# Patient Record
Sex: Female | Born: 1970 | Race: Black or African American | Hispanic: No | Marital: Single | Smoking: Never smoker
Health system: Southern US, Community
[De-identification: ages and names within clinical notes are randomized; demographics above are authoritative.]

---

## 2000-08-24 HISTORY — PX: BREAST BIOPSY: SHX20

## 2021-05-30 ENCOUNTER — Emergency Department (HOSPITAL_COMMUNITY): Payer: Medicaid - Out of State

## 2021-05-30 ENCOUNTER — Emergency Department (HOSPITAL_COMMUNITY)
Admission: EM | Admit: 2021-05-30 | Discharge: 2021-05-30 | Disposition: A | Discharge status: Home / Self Care | Payer: Medicaid - Out of State | Attending: Emergency Medicine | Admitting: Emergency Medicine

## 2021-05-30 ENCOUNTER — Other Ambulatory Visit: Payer: Self-pay

## 2021-05-30 ENCOUNTER — Encounter (HOSPITAL_COMMUNITY): Payer: Self-pay

## 2021-05-30 DIAGNOSIS — H53149 Visual discomfort, unspecified: Secondary | ICD-10-CM | POA: Diagnosis not present

## 2021-05-30 DIAGNOSIS — R112 Nausea with vomiting, unspecified: Secondary | ICD-10-CM | POA: Diagnosis not present

## 2021-05-30 DIAGNOSIS — R519 Headache, unspecified: Secondary | ICD-10-CM | POA: Diagnosis present

## 2021-05-30 DIAGNOSIS — H538 Other visual disturbances: Secondary | ICD-10-CM | POA: Diagnosis not present

## 2021-05-30 LAB — CBC WITH DIFFERENTIAL/PLATELET
Abs Immature Granulocytes: 0.02 10*3/uL (ref 0.00–0.07)
Basophils Absolute: 0.1 10*3/uL (ref 0.0–0.1)
Basophils Relative: 1 %
Eosinophils Absolute: 0.1 10*3/uL (ref 0.0–0.5)
Eosinophils Relative: 1 %
HCT: 37.3 % (ref 36.0–46.0)
Hemoglobin: 12.4 g/dL (ref 12.0–15.0)
Immature Granulocytes: 0 %
Lymphocytes Relative: 26 %
Lymphs Abs: 2.3 10*3/uL (ref 0.7–4.0)
MCH: 29.3 pg (ref 26.0–34.0)
MCHC: 33.2 g/dL (ref 30.0–36.0)
MCV: 88.2 fL (ref 80.0–100.0)
Monocytes Absolute: 0.5 10*3/uL (ref 0.1–1.0)
Monocytes Relative: 6 %
Neutro Abs: 5.9 10*3/uL (ref 1.7–7.7)
Neutrophils Relative %: 66 %
Platelets: 341 10*3/uL (ref 150–400)
RBC: 4.23 MIL/uL (ref 3.87–5.11)
RDW: 15.9 % — ABNORMAL HIGH (ref 11.5–15.5)
WBC: 8.7 10*3/uL (ref 4.0–10.5)
nRBC: 0 % (ref 0.0–0.2)

## 2021-05-30 LAB — I-STAT CHEM 8, ED
BUN: 11 mg/dL (ref 6–20)
Calcium, Ion: 1.1 mmol/L — ABNORMAL LOW (ref 1.15–1.40)
Chloride: 108 mmol/L (ref 98–111)
Creatinine, Ser: 0.6 mg/dL (ref 0.44–1.00)
Glucose, Bld: 110 mg/dL — ABNORMAL HIGH (ref 70–99)
HCT: 38 % (ref 36.0–46.0)
Hemoglobin: 12.9 g/dL (ref 12.0–15.0)
Potassium: 3.8 mmol/L (ref 3.5–5.1)
Sodium: 140 mmol/L (ref 135–145)
TCO2: 22 mmol/L (ref 22–32)

## 2021-05-30 LAB — BASIC METABOLIC PANEL
Anion gap: 7 (ref 5–15)
BUN: 11 mg/dL (ref 6–20)
CO2: 23 mmol/L (ref 22–32)
Calcium: 9.4 mg/dL (ref 8.9–10.3)
Chloride: 106 mmol/L (ref 98–111)
Creatinine, Ser: 0.72 mg/dL (ref 0.44–1.00)
GFR, Estimated: >60 mL/min (ref >60)
Glucose, Bld: 113 mg/dL — ABNORMAL HIGH (ref 70–99)
Potassium: 3.7 mmol/L (ref 3.5–5.1)
Sodium: 136 mmol/L (ref 135–145)

## 2021-05-30 IMAGING — CT CT ANGIO HEAD
3 of 14 series · 15 of 47 positions shown · IV contrast (omnipaque)
Comparison: No pertinent prior exams available for comparison.

CLINICAL DATA: There desiccation acute, stroke suspected; sudden
onset headache at 6 p.m. last night, now with blurry vision,
vomiting.

EXAM:
CT ANGIOGRAPHY HEAD
TECHNIQUE: Multidetector CT imaging of the head was performed using the
standard protocol during bolus administration of intravenous
contrast. Multiplanar CT image reconstructions and MIPs were
obtained to evaluate the vascular anatomy.
CONTRAST:  50mL OMNIPAQUE IOHEXOL 350 MG/ML SOLN

[Series 13: headangio 2.0 hr36 3 · axial · 0.39mm/px · z∈[-123,-81]mm · 2 of 84 slices shown]
[im 21/84  brain]
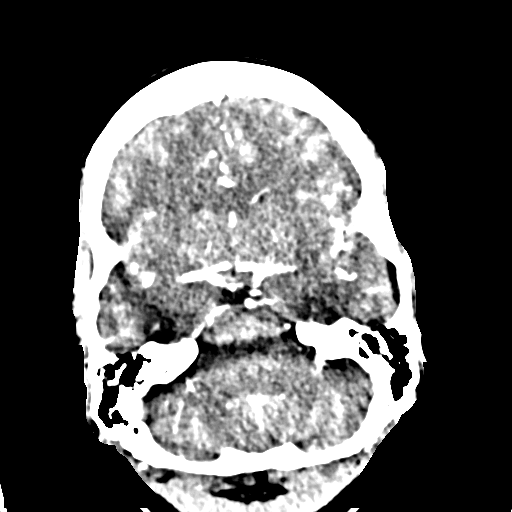
[im 42/84  brain]
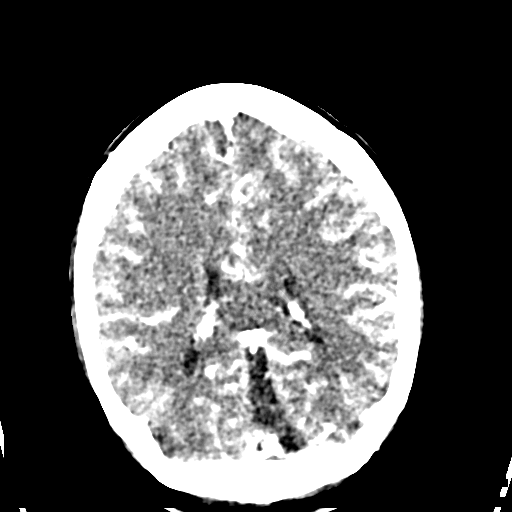

[Series 15: headangio 1.0 mpr ax · axial · 0.34mm/px · z∈[-193,-77]mm · 10 of 169 slices shown]
[im 16/169  brain]
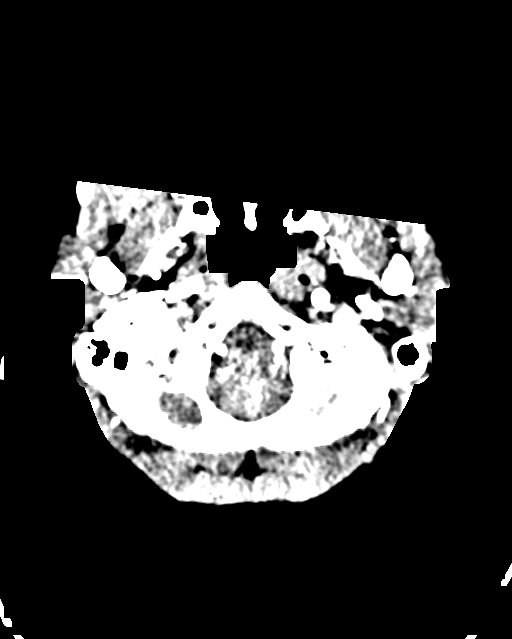
[im 31/169  bone]
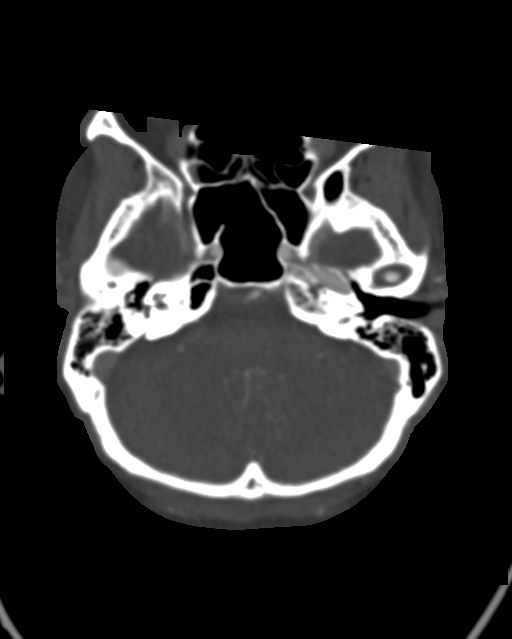
[im 46/169  brain]
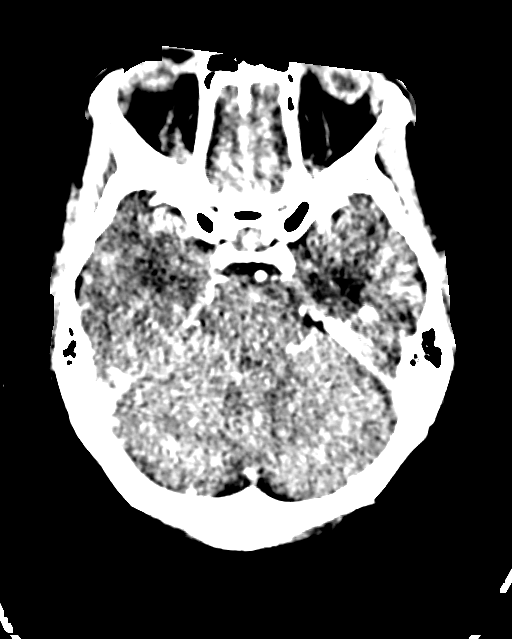
[im 62/169  bone]
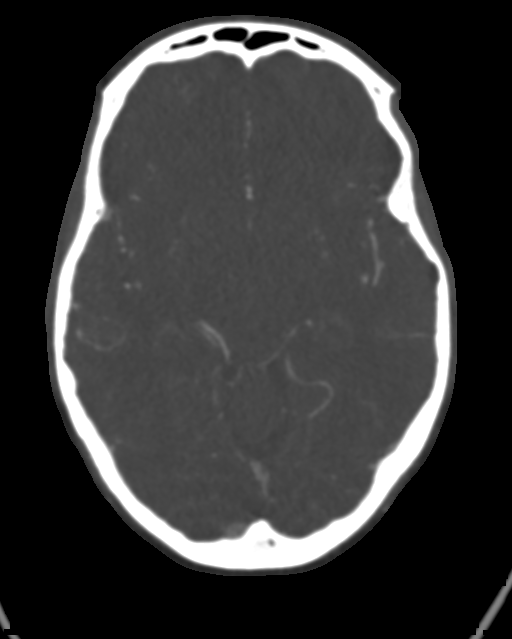
[im 77/169  brain]
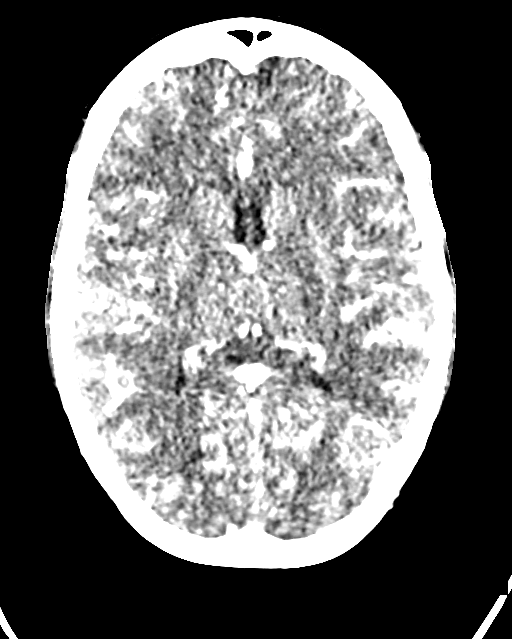
[im 92/169  bone]
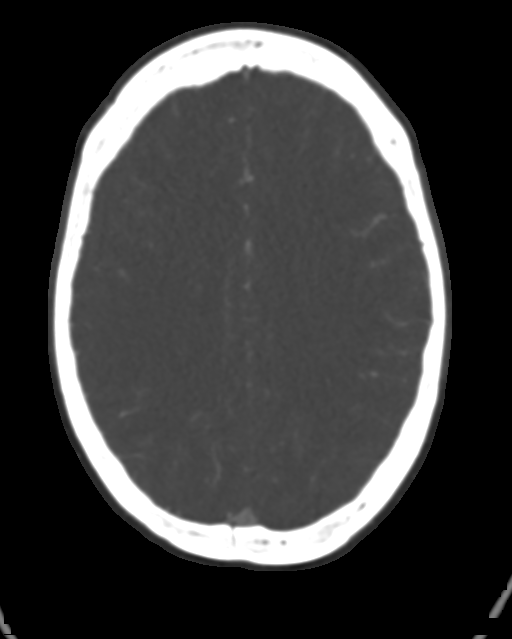
[im 107/169  brain]
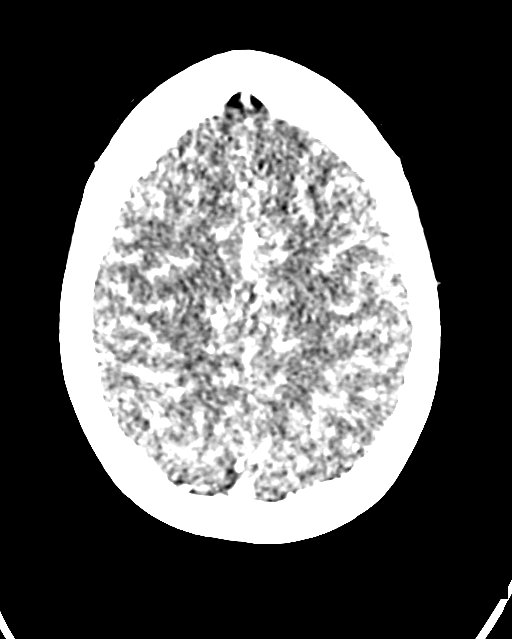
[im 123/169  bone]
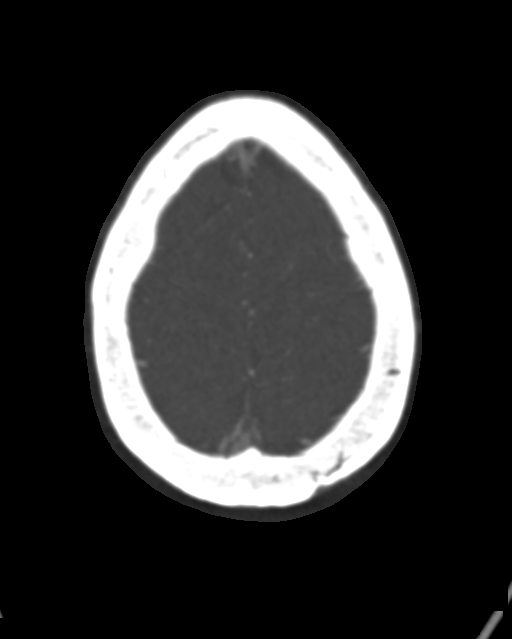
[im 138/169  brain]
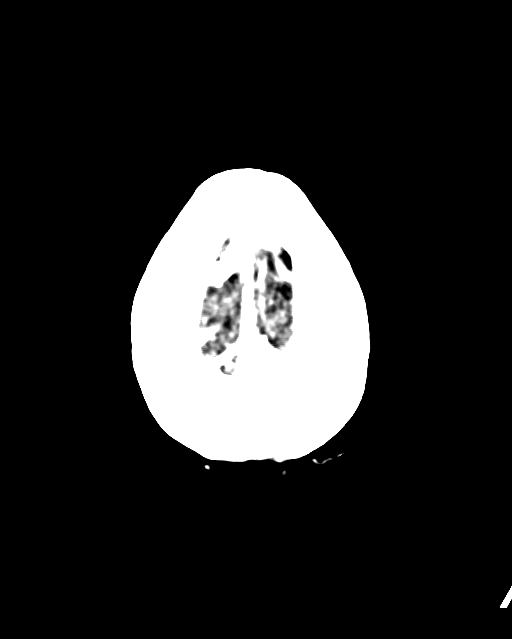
[im 153/169  bone]
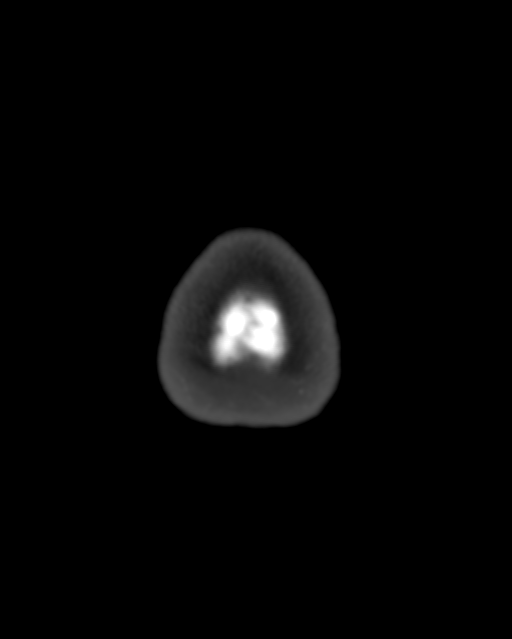

[Series 16: headangio 1.0 mpr cor · coronal · 0.33mm/px · 3 of 209 slices shown]
[im 73/209  brain]
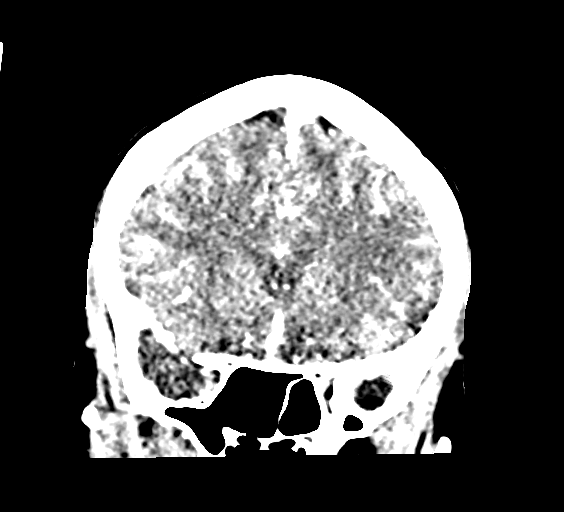
[im 94/209  brain]
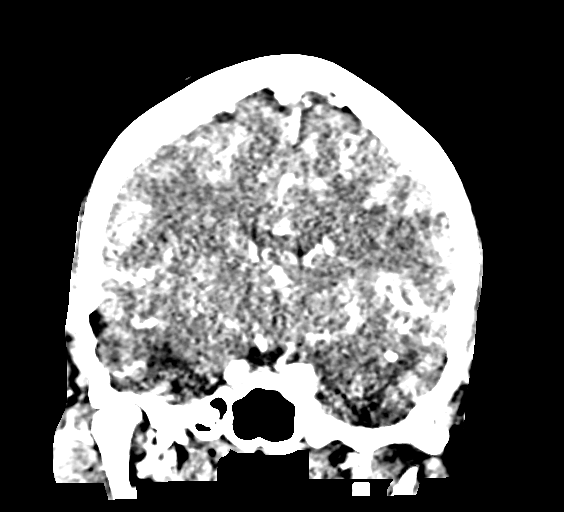
[im 115/209  brain]
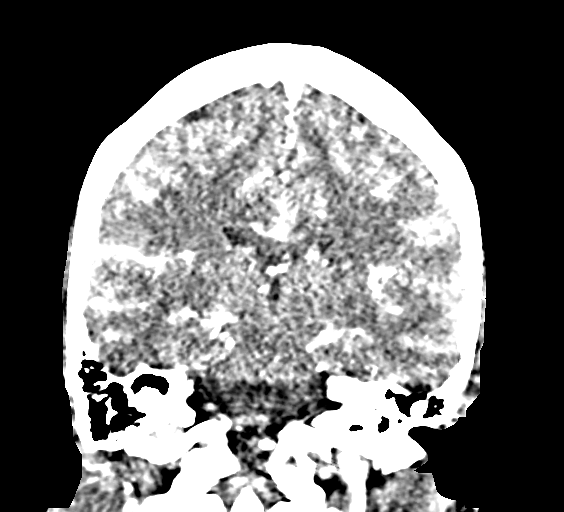

[15 of 47 positions shown; findings below may reference images not displayed]

FINDINGS: CT AYATI

AYATI

Cerebral volume is normal.

Cerebellar tonsillar ectopia (right greater than left) with the
right cerebellar tonsil extending up to 6 mm below the level of
foramen magnum.

There is no acute intracranial hemorrhage.

No demarcated cortical infarct.

No extra-axial fluid collection.

No evidence of an intracranial mass.

No midline shift.

Vascular: No hyperdense vessel.

Skull: Normal. Negative for fracture or focal lesion.

Sinuses: No significant paranasal sinus disease.

Orbits: No mass or acute finding.

CTA HEAD

Anterior circulation:

The intracranial internal carotid arteries are patent. The M1 middle
cerebral arteries are patent. No M2 proximal branch occlusion or
high-grade proximal stenosis is identified. The anterior cerebral
arteries are patent.

2 mm posteriorly projecting vascular protrusion arising from the
proximal cavernous right ICA, which may reflect an aneurysm or
infundibulum (series 15, image 130).

1-2 mm posteriorly projecting vascular protrusion arising from the
proximal cavernous left ICA, which may reflect an aneurysm or
infundibulum (series 15, image 130).

Posterior circulation:

The intracranial vertebral arteries are patent. The basilar artery
is patent. The posterior cerebral arteries are patent. A right
posterior communicating artery is present. Left posterior
communicating artery is diminutive or absent.

Venous sinuses: Within the limitations of contrast timing, no
convincing thrombus.

Anatomic variants: As described

Review of the MIP images confirms the above findings.
IMPRESSION: CT head:

1. No evidence of acute intracranial abnormality.
2. Cerebellar tonsillar ectopia (right greater than left) with the
right cerebellar tonsil extending up to 6 mm below the level of
foramen magnum. This meets measurement criteria for Chiari 1
malformation. However, the cerebellar tonsils maintain a normal
rounded morphology. Alternatively, cerebellar tonsillar ectopia may
be seen in setting of idiopathic intracranial hypertension
(pseudotumor cerebri) or intracranial hypotension. Consider a brain
MRI for further characterization.

CTA head:

1. No intracranial large vessel occlusion or proximal high-grade
arterial stenosis.
2. 2 mm posteriorly projecting vascular protrusion arising from the
proximal cavernous right ICA, which may reflect an aneurysm or
infundibulum.
3. 1-2 mm posteriorly projecting vascular protrusion arising from
the proximal cavernous left ICA, which may reflect an aneurysm or
infundibulum.

## 2021-05-30 IMAGING — MR MR HEAD WO/W CM
6 of 12 series · 26 of 48 positions shown · IV contrast (gadavist)
Comparison: Head CTA [DATE]

CLINICAL DATA: Acute headache, photophobia, blurry vision, nausea,
and vomiting.

EXAM:
MRI HEAD WITHOUT AND WITH CONTRAST
MR VENOGRAM HEAD WITHOUT AND WITH CONTRAST
TECHNIQUE: Multiplanar, multi-echo pulse sequences of the brain and surrounding
structures were acquired without and with intravenous contrast.
Angiographic images of the intracranial venous structures were
acquired using MRV technique without and with intravenous contrast.
CONTRAST:  6.5mL GADAVIST GADOBUTROL 1 MMOL/ML IV SOLN

[Series 2: DWI · axial · 3.0mm · 0.94mm/px · z∈[-95,+45]mm · 8 of 100 slices shown (1 of 2)]
[im 1/100]
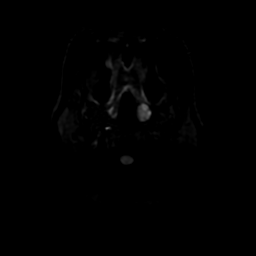
[im 15/100]
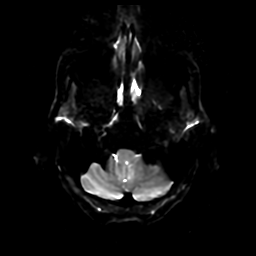
[im 29/100]
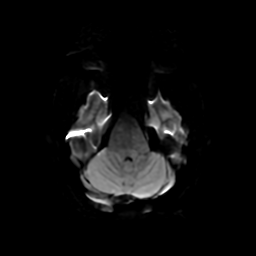
[im 43/100]
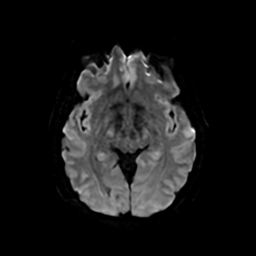
[im 57/100]
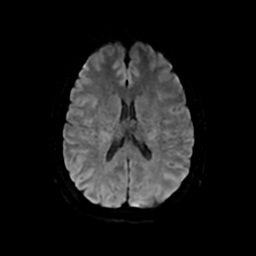
[im 71/100]
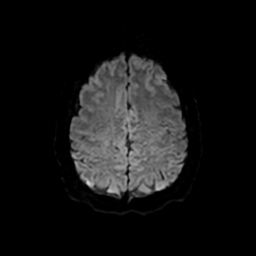
[im 85/100]
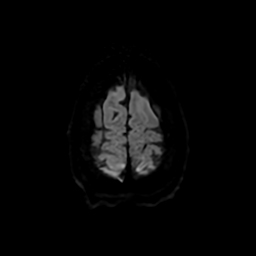
[im 100/100]
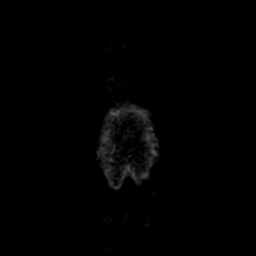

[Series 3: DWI · coronal · 4.0mm · 0.94mm/px · 6 of 76 slices shown (2 of 2)]
[im 1/76]
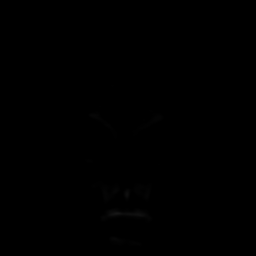
[im 16/76]
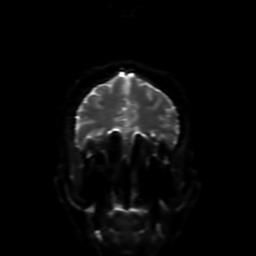
[im 31/76]
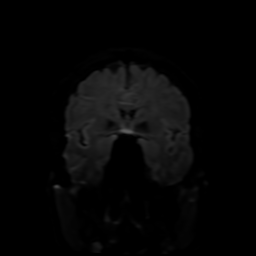
[im 46/76]
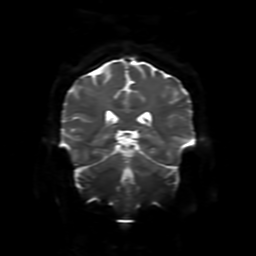
[im 61/76]
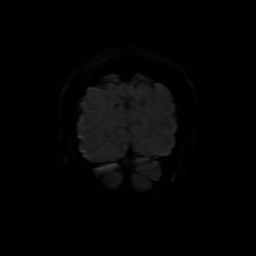
[im 76/76]
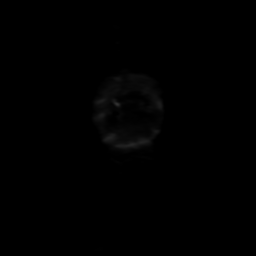

[Series 4: FLAIR · sagittal · 5.0mm · 0.23mm/px · 2 of 23 slices shown (1 of 2)]
[im 1/23]
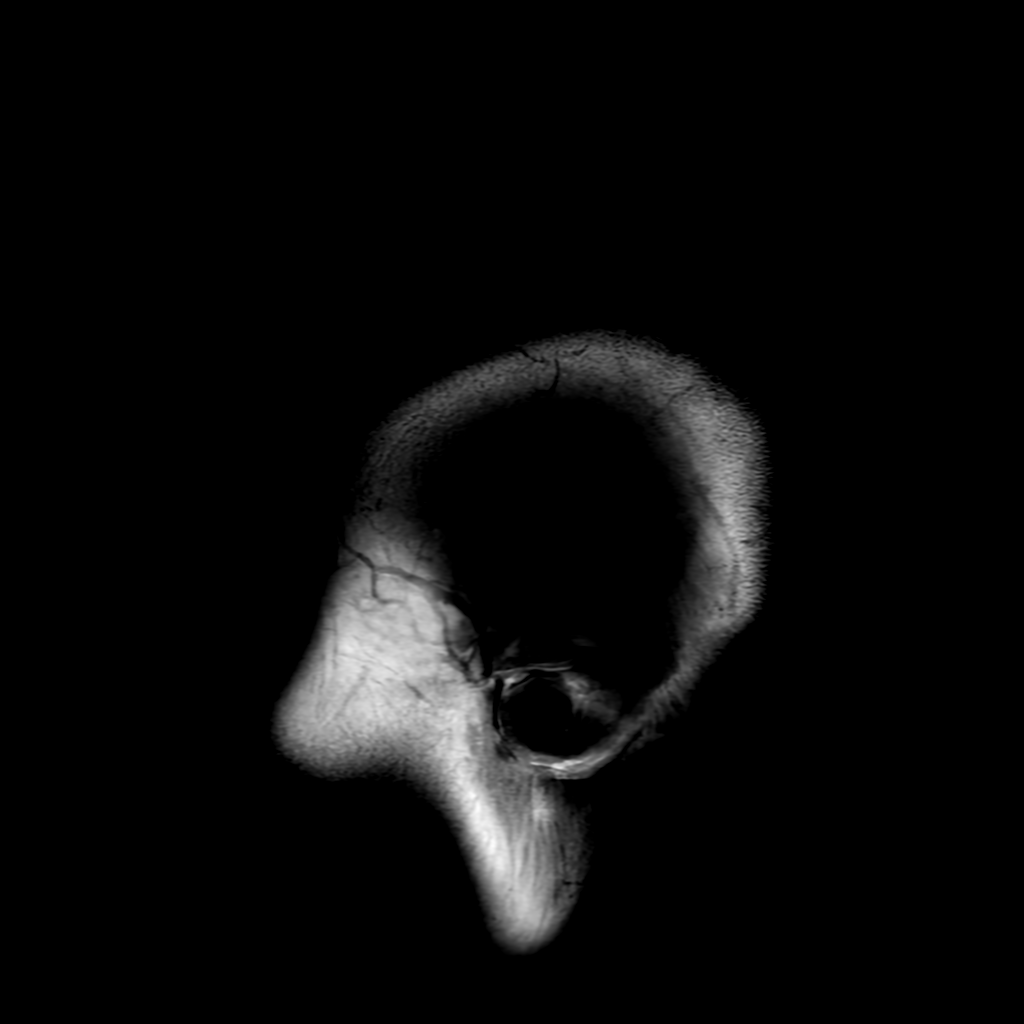
[im 23/23]
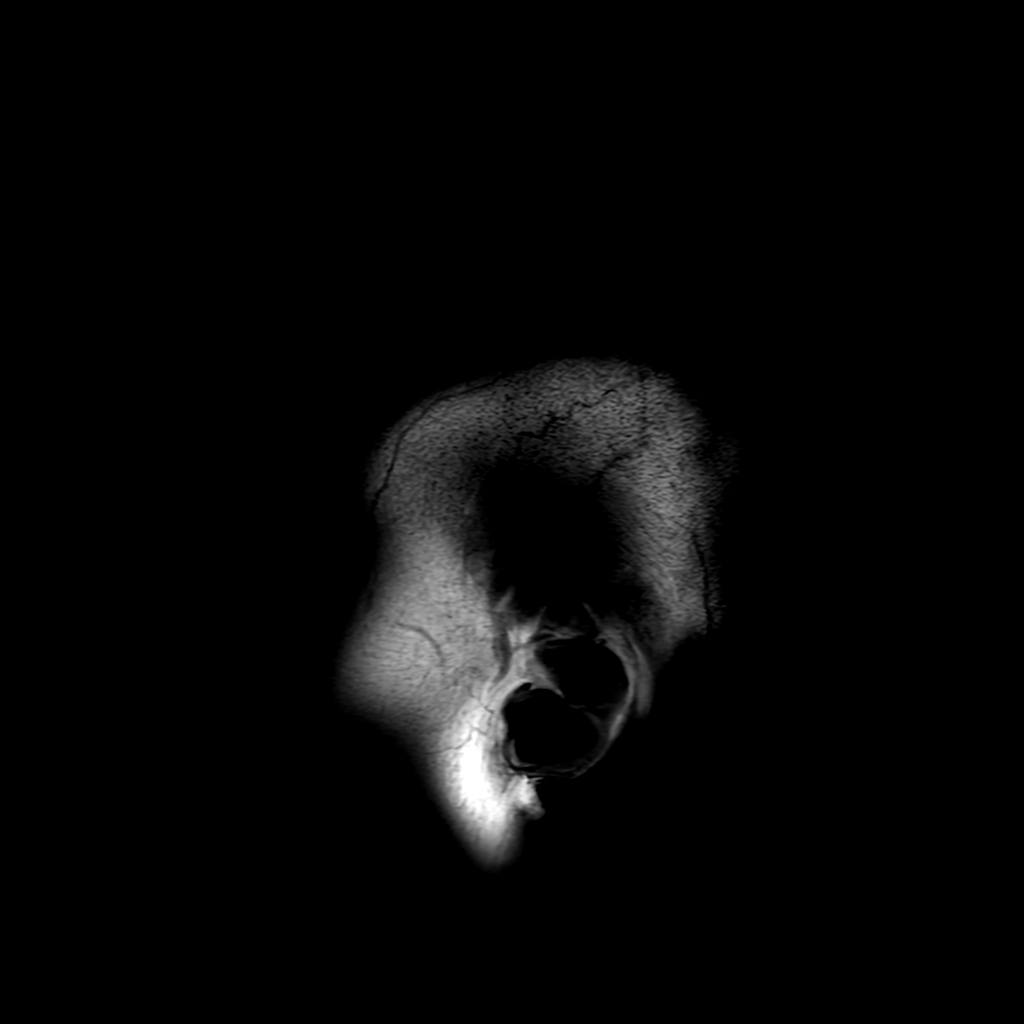

[Series 8: FLAIR · axial · 4.0mm · 0.45mm/px · z∈[-91,+45]mm · 3 of 35 slices shown (2 of 2)]
[im 1/35]
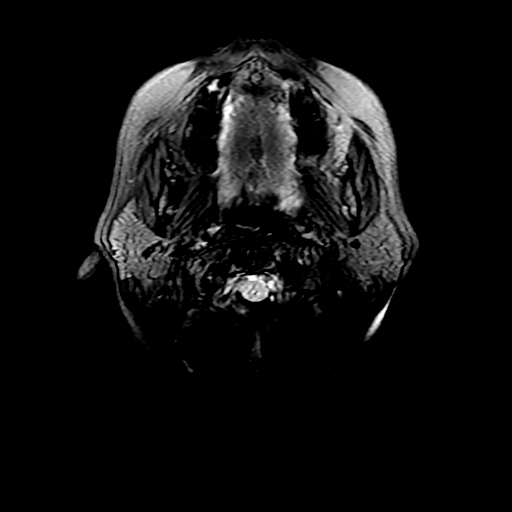
[im 18/35]
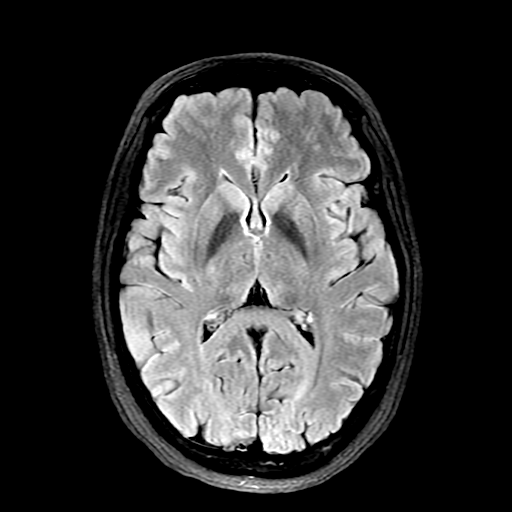
[im 35/35]
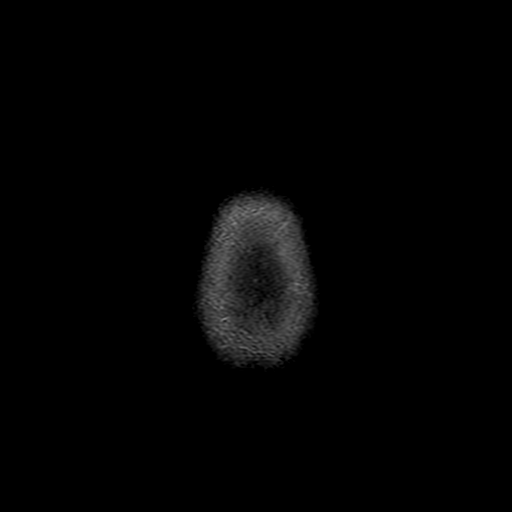

[Series 250: ADC · axial · 3.0mm · 0.94mm/px · z∈[-95,+45]mm · 4 of 52 slices shown (1 of 2)]
[im 1/52]
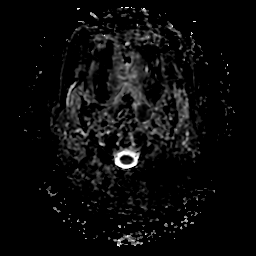
[im 18/52]
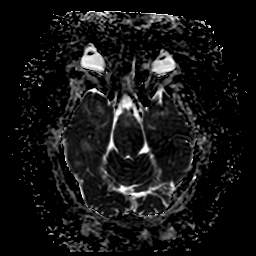
[im 35/52]
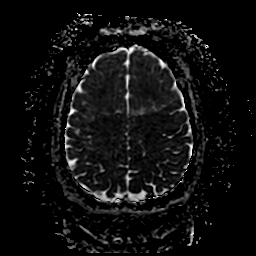
[im 52/52]
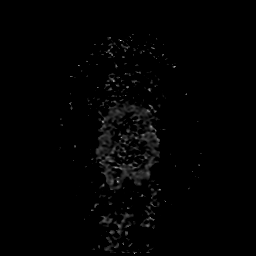

[Series 350: ADC · coronal · 4.0mm · 0.94mm/px · 3 of 38 slices shown (2 of 2)]
[im 1/38]
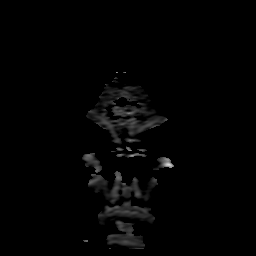
[im 19/38]
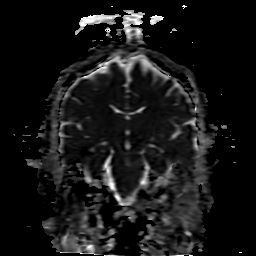
[im 38/38]
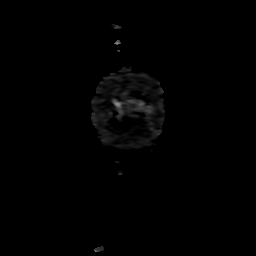

[26 of 48 positions shown; findings below may reference images not displayed]

FINDINGS: MRI HEAD WITHOUT AND WITH CONTRAST

Brain: There is no evidence of an acute infarct, intracranial
hemorrhage, mass, midline shift, or extra-axial fluid collection.
Cerebellar tonsillar ectopia measures 6 mm with a slightly pointed
appearance of the right tonsil. The ventricles and sulci are normal.
A few small foci of T2 hyperintensity in the cerebral white matter
are nonspecific and not considered to be abnormal for age. No
abnormal enhancement is identified. The pituitary gland is normal in
size.

Vascular: Major intracranial vascular flow voids are preserved.

Skull and upper cervical spine: Unremarkable bone marrow signal.

Sinuses/Orbits: Mild elongation of the posterior globes. Insert
sinus

Other: None.

MR VENOGRAM HEAD WITHOUT AND WITH CONTRAST

The superior sagittal sinus, internal cerebral veins, vein of KRUTI,
straight sinus, transverse sinuses, sigmoid sinuses, and jugular
bulbs are patent without evidence of thrombus or significant
stenosis.
IMPRESSION: 1. 6 mm of cerebellar tonsillar ectopia/mild Chiari I malformation.
2. Otherwise unremarkable appearance of the brain. No acute
intracranial abnormality.
3. Negative head MRV.

## 2021-05-30 IMAGING — MR MR MRV HEAD WO/W CM
4 of 5 series · 19 of 48 positions shown · IV contrast (gadavist)
Comparison: Head CTA [DATE]

CLINICAL DATA: Acute headache, photophobia, blurry vision, nausea,
and vomiting.

EXAM:
MRI HEAD WITHOUT AND WITH CONTRAST
MR VENOGRAM HEAD WITHOUT AND WITH CONTRAST
TECHNIQUE: Multiplanar, multi-echo pulse sequences of the brain and surrounding
structures were acquired without and with intravenous contrast.
Angiographic images of the intracranial venous structures were
acquired using MRV technique without and with intravenous contrast.
CONTRAST:  6.5mL GADAVIST GADOBUTROL 1 MMOL/ML IV SOLN

[Series 6: MRV · coronal · 1.5mm · 0.43mm/px · 6 of 130 slices shown]
[im 1/130]
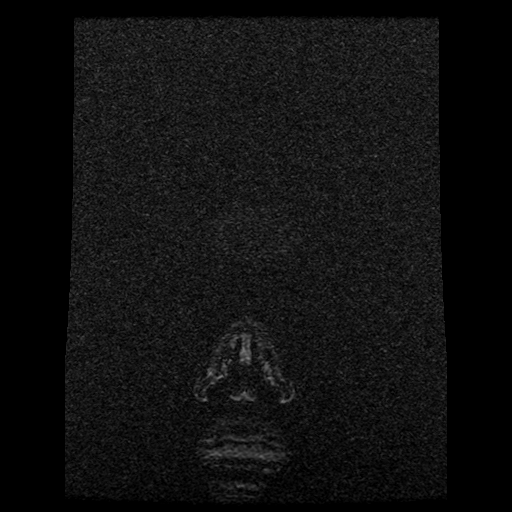
[im 26/130]
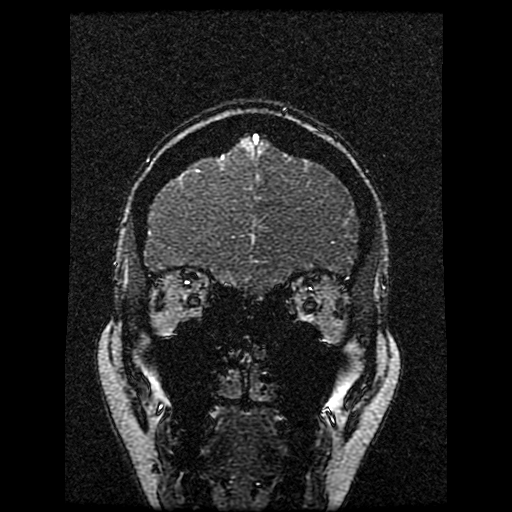
[im 52/130]
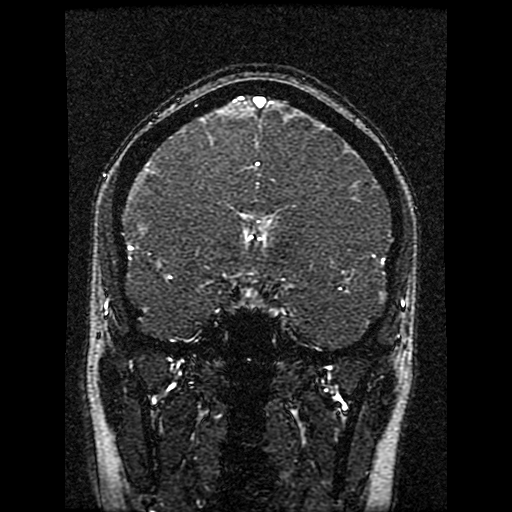
[im 78/130]
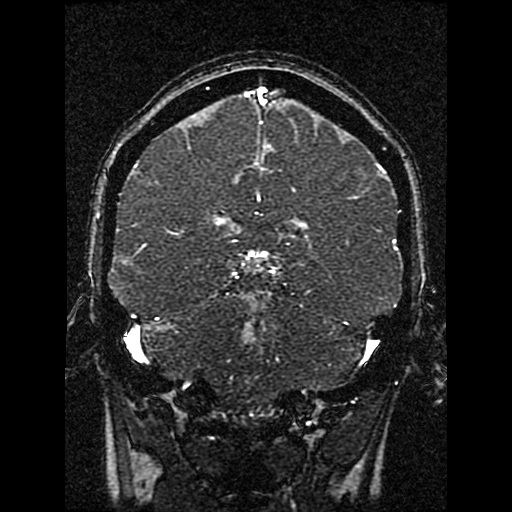
[im 104/130]
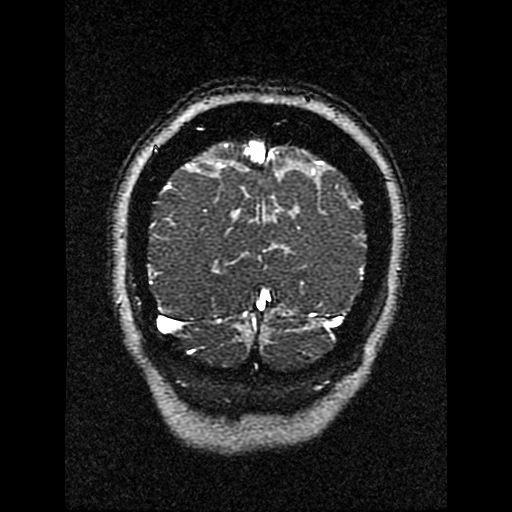
[im 130/130]
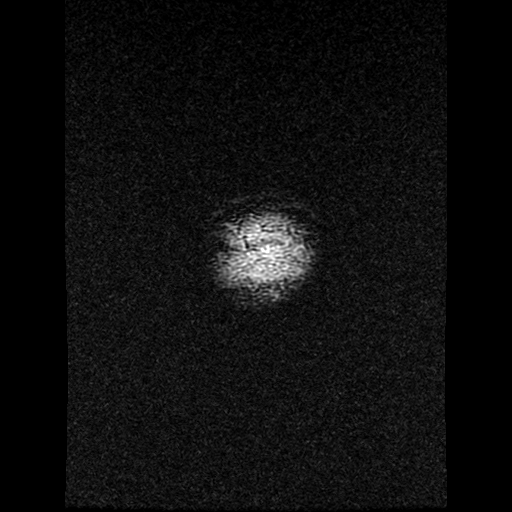

[Series 7: sag inhance (id) · sagittal · 1.8mm · 0.47mm/px · 7 of 325 slices shown]
[im 1/325]
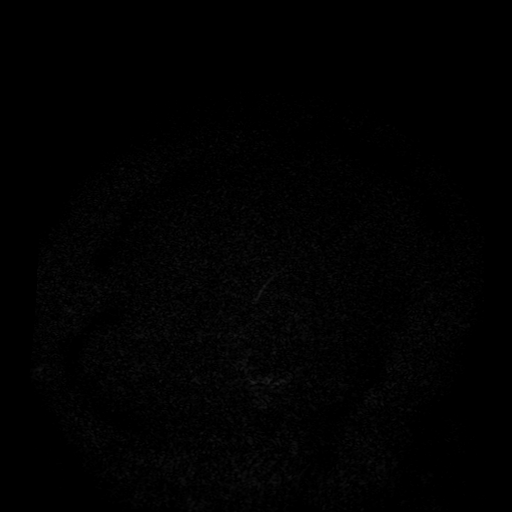
[im 47/325]
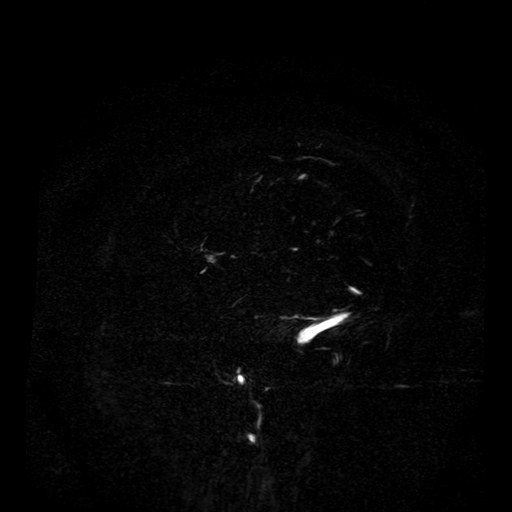
[im 93/325]
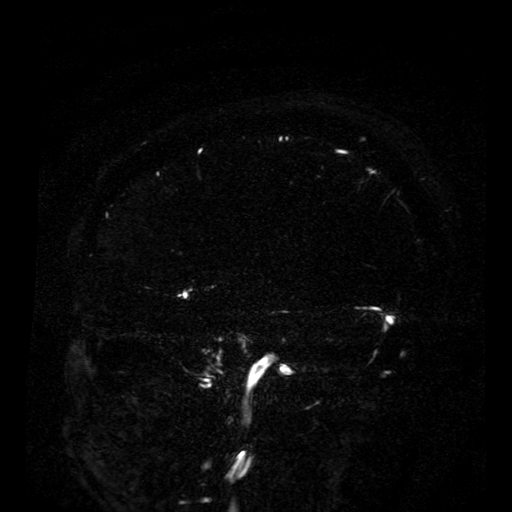
[im 139/325]
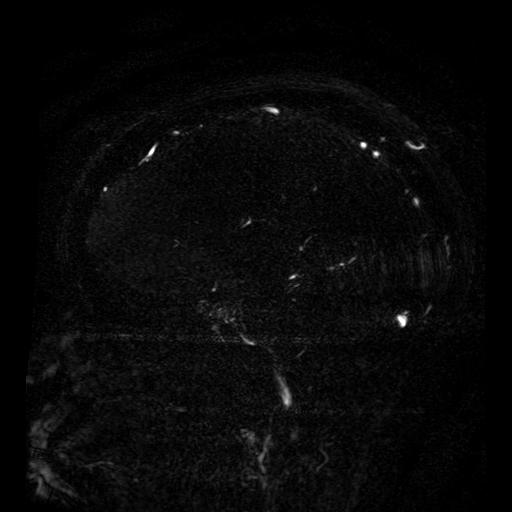
[im 163/325]
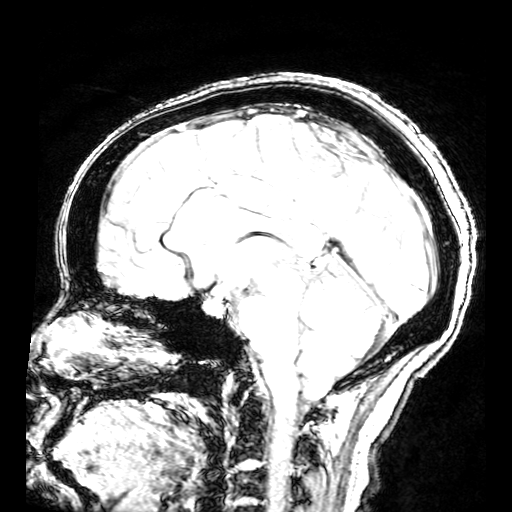
[im 186/325]
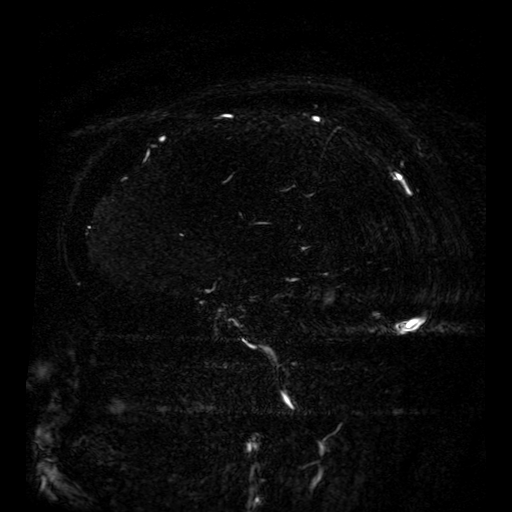
[im 278/325]
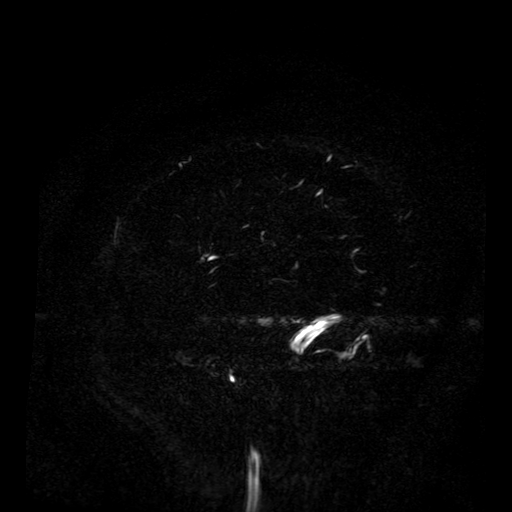

[Series 1200: multiplanar reconstruction (mpr) · sagittal · 0.9mm · 0.47mm/px · 3 of 205 slices shown (1 of 2)]
[im 26/205]
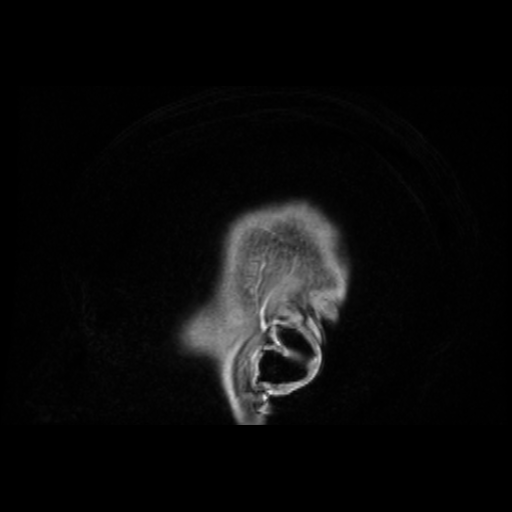
[im 103/205]
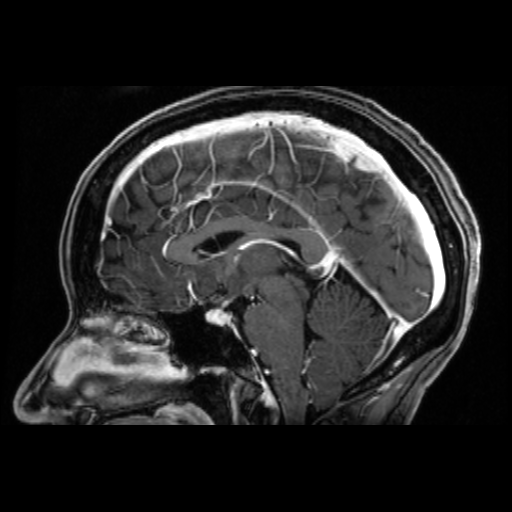
[im 179/205]
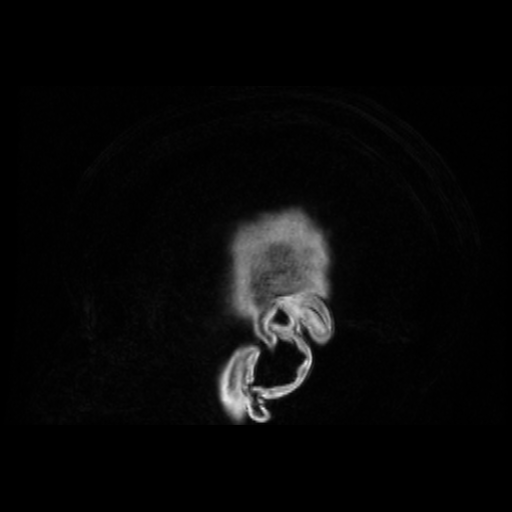

[Series 1201: multiplanar reconstruction (mpr) · coronal · 0.9mm · 0.47mm/px · 3 of 255 slices shown (2 of 2)]
[im 26/255]
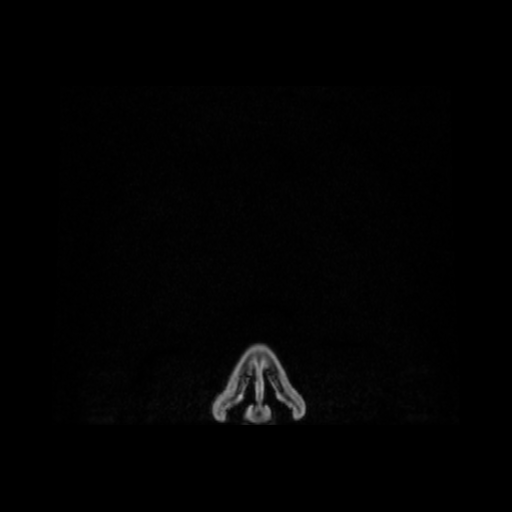
[im 128/255]
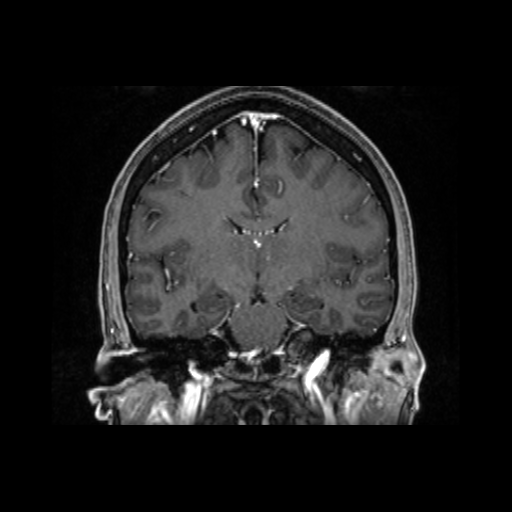
[im 229/255]
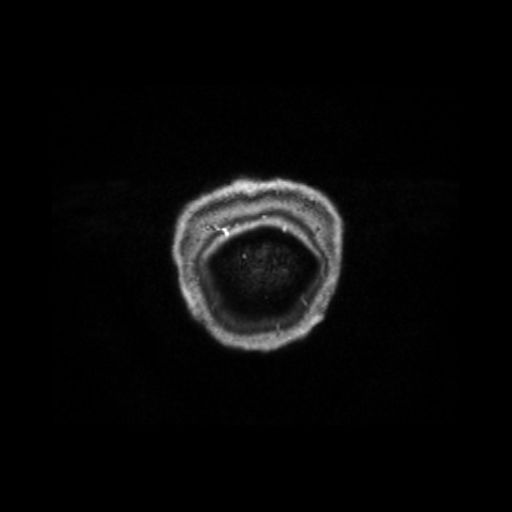

[19 of 48 positions shown; findings below may reference images not displayed]

FINDINGS: MRI HEAD WITHOUT AND WITH CONTRAST

Brain: There is no evidence of an acute infarct, intracranial
hemorrhage, mass, midline shift, or extra-axial fluid collection.
Cerebellar tonsillar ectopia measures 6 mm with a slightly pointed
appearance of the right tonsil. The ventricles and sulci are normal.
A few small foci of T2 hyperintensity in the cerebral white matter
are nonspecific and not considered to be abnormal for age. No
abnormal enhancement is identified. The pituitary gland is normal in
size.

Vascular: Major intracranial vascular flow voids are preserved.

Skull and upper cervical spine: Unremarkable bone marrow signal.

Sinuses/Orbits: Mild elongation of the posterior globes. Insert
sinus

Other: None.

MR VENOGRAM HEAD WITHOUT AND WITH CONTRAST

The superior sagittal sinus, internal cerebral veins, vein of KRUTI,
straight sinus, transverse sinuses, sigmoid sinuses, and jugular
bulbs are patent without evidence of thrombus or significant
stenosis.
IMPRESSION: 1. 6 mm of cerebellar tonsillar ectopia/mild Chiari I malformation.
2. Otherwise unremarkable appearance of the brain. No acute
intracranial abnormality.
3. Negative head MRV.

## 2021-05-30 MED ORDER — ONDANSETRON 4 MG PO TBDP
4.0000 mg | ORAL_TABLET | Freq: Once | ORAL | Status: AC
  Administered 2021-05-30: 4 mg via ORAL
  Filled 2021-05-30: qty 1

## 2021-05-30 MED ORDER — FENTANYL CITRATE PF 50 MCG/ML IJ SOSY
50.0000 ug | PREFILLED_SYRINGE | Freq: Once | INTRAMUSCULAR | Status: AC
  Administered 2021-05-30: 50 ug via INTRAVENOUS
  Filled 2021-05-30: qty 1

## 2021-05-30 MED ORDER — ACETAMINOPHEN 325 MG PO TABS
650.0000 mg | ORAL_TABLET | Freq: Once | ORAL | Status: AC
  Administered 2021-05-30: 650 mg via ORAL
  Filled 2021-05-30: qty 2

## 2021-05-30 MED ORDER — BUTALBITAL-APAP-CAFFEINE 50-325-40 MG PO TABS: 1.0000 | ORAL_TABLET | Freq: Three times a day (TID) | ORAL | 0 refills | Status: AC | PRN

## 2021-05-30 MED ORDER — MAGNESIUM SULFATE IN D5W 1-5 GM/100ML-% IV SOLN: 1.0000 g | Freq: Once | INTRAVENOUS | Status: DC

## 2021-05-30 MED ORDER — DIPHENHYDRAMINE HCL 50 MG/ML IJ SOLN
25.0000 mg | Freq: Once | INTRAMUSCULAR | Status: AC
  Administered 2021-05-30: 25 mg via INTRAVENOUS
  Filled 2021-05-30: qty 1

## 2021-05-30 MED ORDER — GADOBUTROL 1 MMOL/ML IV SOLN
6.5000 mL | Freq: Once | INTRAVENOUS | Status: AC | PRN
  Administered 2021-05-30: 6.5 mL via INTRAVENOUS

## 2021-05-30 MED ORDER — SODIUM CHLORIDE 0.9 % IV BOLUS
1000.0000 mL | Freq: Once | INTRAVENOUS | Status: AC
Start: 2021-05-30 — End: 2021-05-30
  Administered 2021-05-30: 1000 mL via INTRAVENOUS

## 2021-05-30 MED ORDER — MAGNESIUM SULFATE IN D5W 1-5 GM/100ML-% IV SOLN
1.0000 g | Freq: Once | INTRAVENOUS | Status: AC
  Administered 2021-05-30: 1 g via INTRAVENOUS
  Filled 2021-05-30: qty 100

## 2021-05-30 MED ORDER — METOCLOPRAMIDE HCL 5 MG/ML IJ SOLN
10.0000 mg | Freq: Once | INTRAMUSCULAR | Status: AC
  Administered 2021-05-30: 10 mg via INTRAVENOUS
  Filled 2021-05-30: qty 2

## 2021-05-30 MED ORDER — IOHEXOL 350 MG/ML SOLN
50.0000 mL | Freq: Once | INTRAVENOUS | Status: AC | PRN
  Administered 2021-05-30: 50 mL via INTRAVENOUS

## 2021-05-30 MED ORDER — PROCHLORPERAZINE EDISYLATE 10 MG/2ML IJ SOLN: 10.0000 mg | Freq: Four times a day (QID) | INTRAMUSCULAR | Status: DC | PRN

## 2021-05-30 NOTE — Discharge Instructions (Signed)

## 2021-05-30 NOTE — ED Triage Notes (Signed)
Pt reports headache that started yesterday associated with chills, nausea and blurry vision onset this morning at 0400. Denies numbness or weakness. A&OX4, ambulatory. + photosensitivity

## 2021-05-30 NOTE — ED Provider Notes (Signed)
[facility] EMERGENCY DEPARTMENT Provider Note   CSN: [id number] Arrival date & time: 05/30/21  0938     History Chief Complaint  Patient presents with   Headache    Jamie Wade is a 50 y.o. female.  Who presents emergency department chief complaint of headache.  Had sudden onset of severe bitemporal headache last night at 6 PM while working on her computer.  She slept at night and then when she woke up this morning had onset of photophobia, blurry vision and nausea and vomiting.  She states she gets headaches sometimes but has never had a headache like this in the past.  She denies fevers, chills, neck stiffness, rash.  Patient was seen in triage and had concern for potential subarachnoid hemorrhage.  CT angiogram of the head along with MRI and MRV ordered prior to my evaluation.  Imaging is all negative except for evidence of a Chiari malformation type I.   Headache     History reviewed. No pertinent past medical history.  There are no problems to display for this patient.   Past Surgical History:  Procedure Laterality Date   BREAST BIOPSY Right 2002     OB History   No obstetric history on file.     No family history on file.  Social History   Tobacco Use   Smoking status: Never   Smokeless tobacco: Never  Vaping Use   Vaping Use: Never used  Substance Use Topics   Alcohol use: Yes   Drug use: Never    Home Medications Prior to Admission medications   Not on File    Allergies    Fruit extracts  Review of Systems   Review of Systems  Neurological:  Positive for headaches.  Ten systems reviewed and are negative for acute change, except as noted in the HPI.   Physical Exam Updated Vital Signs BP 109/64 (BP Location: Right Arm)   Pulse 73   Temp 98.6 F (37 C)   Resp 18   Ht 5\' 3"  (1.6 m)   Wt 65.8 kg   LMP 05/25/2021 (Exact Date)   SpO2 100%   BMI 25.69 kg/m   Physical Exam Physical Exam  Constitutional: Pt is  oriented to person, place, and time. Pt appears well-developed and well-nourished. No distress.  HENT:  Head: Normocephalic and atraumatic.  Mouth/Throat: Oropharynx is clear and moist.  Eyes: Conjunctivae and EOM are normal. Pupils are equal, round, and reactive to light. No scleral icterus.  No horizontal, vertical or rotational nystagmus  Neck: Normal range of motion. Neck supple.  Full active and passive ROM without pain No midline or paraspinal tenderness No nuchal rigidity or meningeal signs  Cardiovascular: Normal rate, regular rhythm and intact distal pulses.   Pulmonary/Chest: Effort normal and breath sounds normal. No respiratory distress. Pt has no wheezes. No rales.  Abdominal: Soft. Bowel sounds are normal. There is no tenderness. There is no rebound and no guarding.  Musculoskeletal: Normal range of motion.  Lymphadenopathy:    No cervical adenopathy.  Neurological: Pt. is alert and oriented to person, place, and time. He has normal reflexes. No cranial nerve deficit.  Exhibits normal muscle tone. Coordination normal.  Mental Status:  Alert, oriented, thought content appropriate. Speech fluent without evidence of aphasia. Able to follow 2 step commands without difficulty.  Cranial Nerves:  II:  Peripheral visual fields grossly normal, pupils equal, round, reactive to light III,IV, VI: ptosis not present, extra-ocular motions intact bilaterally  V,VII:  smile symmetric, facial light touch sensation equal VIII: hearing grossly normal bilaterally  IX,X: midline uvula rise  XI: bilateral shoulder shrug equal and strong XII: midline tongue extension  Motor:  5/5 in upper and lower extremities bilaterally including strong and equal grip strength and dorsiflexion/plantar flexion Sensory: Pinprick and light touch normal in all extremities.  Deep Tendon Reflexes: 2+ and symmetric  Cerebellar: normal finger-to-nose with bilateral upper extremities Gait: normal gait and balance CV:  distal pulses palpable throughout   Skin: Skin is warm and dry. No rash noted. Pt is not diaphoretic.  Psychiatric: Pt has a normal mood and affect. Behavior is normal. Judgment and thought content normal.  Nursing note and vitals reviewed.  ED Results / Procedures / Treatments   Labs (all labs ordered are listed, but only abnormal results are displayed) Labs Reviewed  BASIC METABOLIC PANEL - Abnormal; Notable for the following components:      Result Value   Glucose, Bld 113 (*)    All other components within normal limits  CBC WITH DIFFERENTIAL/PLATELET - Abnormal; Notable for the following components:   RDW 15.9 (*)    All other components within normal limits  I-STAT CHEM 8, ED - Abnormal; Notable for the following components:   Glucose, Bld 110 (*)    Calcium, Ion 1.10 (*)    All other components within normal limits  URINALYSIS, ROUTINE W REFLEX MICROSCOPIC  RAPID URINE DRUG SCREEN, HOSP PERFORMED    EKG None  Radiology CT Angio Head W or Wo Contrast  Result Date: 05/30/2021 CLINICAL DATA:  There desiccation acute, stroke suspected; sudden onset headache at 6 p.m. last night, now with blurry vision, vomiting. EXAM: CT ANGIOGRAPHY HEAD TECHNIQUE: Multidetector CT imaging of the head was performed using the standard protocol during bolus administration of intravenous contrast. Multiplanar CT image reconstructions and MIPs were obtained to evaluate the vascular anatomy. CONTRAST:  66mL OMNIPAQUE IOHEXOL 350 MG/ML SOLN COMPARISON:  No pertinent prior exams available for comparison. FINDINGS: CT HEAD Brai Cerebral volume is normal. 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 There is no acute intracranial hemorrhage. No demarcated cortical infarct. No extra-axial fluid collection. No evidence of an intracranial mass. No midline shift. Vascular: No hyperdense vessel. Skull: Normal.  Negative for fracture or focal lesion. Sinuses: No significant paranasal sinus disease. Orbits: No mass or acute finding. CTA HEAD Anterior circulation: The intracranial internal carotid arteries are patent. The M1 middle cerebral arteries are patent. No M2 proximal branch occlusion or high-grade proximal stenosis is identified. The anterior cerebral arteries are patent. 2 mm posteriorly projecting vascular protrusion arising from the proximal cavernous right ICA, which may reflect an aneurysm or infundibulum (series 15, image 130). 1-2 mm posteriorly projecting vascular protrusion arising from the proximal cavernous left ICA, which may reflect an aneurysm or infundibulum (series 15, image 130). Posterior circulation: The intracranial vertebral arteries are patent. The basilar artery is patent. The posterior cerebral arteries are patent. A right posterior communicating artery is present. Left posterior communicating artery is diminutive or absent. Venous sinuses: Within the limitations of contrast timing, no convincing thrombus. Anatomic variants: As described Review of the MIP images confirms the above findings. IMPRESSION: CT head: 1. No evidence of acute intracranial abnormality. 2. 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 This meets measurement criteria for  Chiari 1 malformation. However, the cerebellar tonsils maintain a normal rounded morphology. Alternatively, cerebellar tonsillar ectopia may be seen in setting of idiopathic intracranial hypertension (pseudotumor cerebri) or intracranial hypotension. Consider a brain MRI for further characterization. CTA head: 1. No intracranial large vessel occlusion or proximal high-grade arterial stenosis. 2. 2 mm posteriorly projecting vascular protrusion arising from the proximal cavernous right ICA, which may reflect an aneurysm or infundibulum. 3. 1-2 mm posteriorly  projecting vascular protrusion arising from the proximal cavernous left ICA, which may reflect an aneurysm or infundibulum. Electronically Signed   By: Jackey Loge D.O.   On: 05/30/2021 09:21   MR Brain W and Wo Contrast  Result Date: 05/30/2021 CLINICAL DATA:  Acute headache, photophobia, blurry vision, nausea, and vomiting. EXAM: MRI HEAD WITHOUT AND WITH CONTRAST MR VENOGRAM HEAD WITHOUT AND WITH CONTRAST TECHNIQUE: Multiplanar, multi-echo pulse sequences of the brain and surrounding structures were acquired without and with intravenous contrast. Angiographic images of the intracranial venous structures were acquired using MRV technique without and with intravenous contrast. CONTRAST:  6.49mL GADAVIST GADOBUTROL 1 MMOL/ML IV SOLN COMPARISON:  Head CTA 05/30/2021 FINDINGS: MRI HEAD WITHOUT AND WITH CONTRAST Brain: There is no evidence of an acute infarct, intracranial hemorrhage, mass, midline shift, or extra-axial fluid collection. Cerebellar tonsillar ectopia measures 6 mm with a slightly pointed appearance of the right tonsil. The ventricles and sulci are normal. A few small foci of T2 hyperintensity in the cerebral white matter are nonspecific and not considered to be abnormal for age. No abnormal enhancement is identified. The pituitary gland is normal in size. Vascular: Major intracranial vascular flow voids are preserved. Skull and upper cervical spine: Unremarkable bone marrow signal. Sinuses/Orbits: Mild elongation of the posterior globes. Insert sinus Other: None. MR VENOGRAM HEAD WITHOUT AND WITH CONTRAST The superior sagittal sinus, internal cerebral veins, vein of Galen, straight sinus, transverse sinuses, sigmoid sinuses, and jugular bulbs are patent without evidence of thrombus or significant stenosis. IMPRESSION: 1. 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 2. Otherwise unremarkable appearance of the brain. No acute intracranial abnormality. 3. Negative head MRV.  Electronically Signed   By: Sebastian Ache M.D.   On: 05/30/2021 14:11   MR MRV HEAD W WO CONTRAST  Result Date: 05/30/2021 CLINICAL DATA:  Acute headache, photophobia, blurry vision, nausea, and vomiting. EXAM: MRI HEAD WITHOUT AND WITH CONTRAST MR VENOGRAM HEAD WITHOUT AND WITH CONTRAST TECHNIQUE: Multiplanar, multi-echo pulse sequences of the brain and surrounding structures were acquired without and with intravenous contrast. Angiographic images of the intracranial venous structures were acquired using MRV technique without and with intravenous contrast. CONTRAST:  6.59mL GADAVIST GADOBUTROL 1 MMOL/ML IV SOLN COMPARISON:  Head CTA 05/30/2021 FINDINGS: MRI HEAD WITHOUT AND WITH CONTRAST Brain: There is no evidence of an acute infarct, intracranial hemorrhage, mass, midline shift, or extra-axial fluid collection. Cerebellar tonsillar ectopia measures 6 mm with a slightly pointed appearance of the right tonsil. The ventricles and sulci are normal. A few small foci of T2 hyperintensity in the cerebral white matter are nonspecific and not considered to be abnormal for age. No abnormal enhancement is identified. The pituitary gland is normal in size. Vascular: Major intracranial vascular flow voids are preserved. Skull and upper cervical spine: Unremarkable bone marrow signal. Sinuses/Orbits: Mild elongation of the posterior globes. Insert sinus Other: None. MR VENOGRAM HEAD WITHOUT AND WITH CONTRAST The superior sagittal sinus, internal cerebral veins, vein of Galen, straight sinus, transverse sinuses, sigmoid sinuses, and jugular bulbs are patent without  evidence of thrombus or significant stenosis. IMPRESSION: 1. 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 2. Otherwise unremarkable appearance of the brain. No acute intracranial abnormality. 3. Negative head MRV. Electronically Signed   By: Sebastian Ache M.D.   On: 05/30/2021 14:11    Procedures Procedures   Medications Ordered in  ED Medications  prochlorperazine (COMPAZINE) injection 10 mg (has no administration in time range)  magnesium sulfate IVPB 1 g 100 mL (has no administration in time range)  iohexol (OMNIPAQUE) 350 MG/ML injection 50 mL (50 mLs Intravenous Contrast Given 05/30/21 0849)  acetaminophen (TYLENOL) tablet 650 mg (650 mg Oral Given 05/30/21 1045)  sodium chloride 0.9 % bolus 1,000 mL (1,000 mLs Intravenous New Bag/Given 05/30/21 1420)  diphenhydrAMINE (BENADRYL) injection 25 mg (25 mg Intravenous Given 05/30/21 1420)  ondansetron (ZOFRAN-ODT) disintegrating tablet 4 mg (4 mg Oral Given 05/30/21 1045)  gadobutrol (GADAVIST) 1 MMOL/ML injection 6.5 mL (6.5 mLs Intravenous Contrast Given 05/30/21 1341)    ED Course  I have reviewed the triage vital signs and the nursing notes.  Pertinent labs & imaging results that were available during my care of the patient were reviewed by me and considered in my medical decision making (see chart for details).    MDM Rules/Calculators/A&P                           Patient with headache. Emergent considerations for headache include subarachnoid hemorrhage, meningitis, temporal arteritis, glaucoma, cerebral ischemia, carotid/vertebral dissection, intracranial tumor, Venous sinus thrombosis, carbon monoxide poisoning, acute or chronic subdural hemorrhage.  Other considerations include: Migraine, Cluster headache, Hypertension, Caffeine, alcohol, or drug withdrawal, Pseudotumor cerebri, Arteriovenous malformation, Head injury, Neurocysticercosis, Post-lumbar puncture, Preeclampsia, Tension headache, Sinusitis, Cervical arthritis, Refractive error causing strain, Dental abscess, Otitis media, Temporomandibular joint syndrome, Depression, Somatoform disorder (eg, somatization) Trigeminal neuralgia, Glossopharyngeal neuralgia.  Case discussed with Dr. Iver Nestle who states patient may need an LP but I could confer with neurosurgery. Case discussed with NP Julien Girt who states that  if the neurologist thinks LP is in order that would be ok. I have little concern for [facility] and feel that at 24 hours out from onset of HA and with no findings concerning for aneurysm or SAH on all images is very reassuring that this is not related to either. She has no signs of meningitis and has no focal neuro deficits. She has had a hx of headaches for years and they seem like migraines.  Patient seen and shared visit with Dr. Rodena Medin.  In shared decision making, review of all work-up and after discussion of risks and benefits the patient has decided not to undergo LP at this time.  Patient will be given referral to neurology for history of chronic headache. She appears safe for discharge at this time. Final Clinical Impression(s) / ED Diagnoses Final diagnoses:  None    Rx / DC Orders ED Discharge Orders     None        Arthor Captain, PA-C 05/30/21 1755    Wynetta Fines, MD 05/30/21 2310

## 2021-05-30 NOTE — ED Provider Notes (Addendum)
Emergency Medicine Provider Triage Evaluation Note  Jamie Wade , a 50 y.o. female  was evaluated in triage.  Pt complains of headache. Reports sudden onset severe headache at 6pm while sitting at work, bitemporal at onset, now diffuse frontal. Woke up with photophobia, mild blurry vision both eyes, nausea and vomiting. No changes in speech, gait, unilateral weakness or numbness. Prior headaches but nothing similar to this. Otherwise healthy, not on thinners.  Review of Systems  Positive: Headache, nausea, vomiting Negative: Fever, neck pain, weakness, numbness  Physical Exam  BP (!) 127/97   Pulse 85   Temp 98.1 F (36.7 C) (Oral)   Resp 20   Ht 5\' 3"  (1.6 m)   Wt 65.8 kg   LMP 05/25/2021 (Exact Date)   SpO2 99%   BMI 25.69 kg/m  Gen:   Awake, appears uncomfortable. Mild conjunctival infection   Resp:  Normal effort  MSK:   Moves extremities without difficulty  Other:    Medical Decision Making  Medically screening exam initiated at 7:12 AM.  Appropriate orders placed.  Eular Panek was informed that the remainder of the evaluation will be completed by another provider, this initial triage assessment does not replace that evaluation, and the importance of remaining in the ED until their evaluation is complete.  Istat chem 8 to expedite CTA head to evaluate for [facility]. Discussed with Dr. [facility], ER attending, agrees with plan of care.   Dalene Seltzer, PA-C 05/30/21 07/30/21  CTA head reviewed with Dr. [id number]. Concern for aneurysms. Discussed with Dr. Dalene Seltzer, neurohospitalist who has reviewed images. Recommends MRI brain with and without contrast, MRV.  Consider LP with opening pressures after MRI.  Recommends treatment with headache cocktail without steroids and NSAIDs such as Tylenol, magnesium, fluids, Compazine, Benadryl.  Discussed results with patient and her significant other in the lobby.  Patient continues to be nauseous, was given Zofran, also given  Tylenol.  Discussed CT results with plan for follow-up with MRI and possible LP.  Patient verbalizes understanding.  Informed patient and [facility] to notify staff if her condition changes or they have any concerns at any time.     Iver Nestle, PA-C 05/30/21 1054    07/30/21, MD 05/31/21 [phone]

## 2021-05-30 NOTE — ED Notes (Signed)
EDPA finished at [facility]. EDP into room, at [facility].

## 2021-05-30 NOTE — ED Notes (Signed)
Alert, NAD, calm, interactive, speech clear, steady gait back from b/r.

## 2021-05-30 NOTE — Plan of Care (Signed)
Briefly discussed this patient with no known significant past medical history (potentially infrequent medical care) with triage PA Army Melia, who performed an initial screening evaluation and noted this patient has had a sudden onset headache starting at 6 PM yesterday evening (10/6) which worsened overnight at 4 AM associated with nausea and emesis as well as some blurry vision.  While the patient does endorse some history of headaches, this is highly typical headache for her.    CTA has been obtained and preliminary guidance on further work-up was requested while patient is waiting in triage.  Vital signs have been stable including normal blood pressure and CBC/CMP are reassuring.  Recommended MRI brain with and without contrast, MRV Pending this imaging, will also likely need a lumbar puncture to more definitively rule out subarachnoid hemorrhage as an etiology given CT scan was obtained greater than 5 hours after symptom onset.  Headache is described does not sound concerning for either intracranial hypo or hypertension however opening pressure can be obtained at the time LP is done. In the interim recommend migraine cocktail of Tylenol, magnesium, Reglan, Benadryl for symptomatic relief, avoiding NSAIDs in case of subarachnoid hemorrhage Additionally recommend UA/UDS Depending on patient's clinical course and the work-up above, may be appropriate for outpatient work-up versus full neurology consultation, please do reach out if new questions or concerns arise.  These brief recommendations do not replace a full consult and we are available as needed  8:49 AM 10/7 imaging personally reviewed, agree with radiology read CT head: 1. No evidence of acute intracranial abnormality. 2. 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 However, the  cerebellar tonsils maintain a normal rounded morphology. Alternatively, cerebellar tonsillar ectopia may be seen in setting of idiopathic intracranial hypertension (pseudotumor cerebri) or intracranial hypotension. Consider a brain MRI for further characterization.   CTA head:  1. No intracranial large vessel occlusion or proximal high-grade arterial stenosis. 2. 2 mm posteriorly projecting vascular protrusion arising from the proximal cavernous right ICA, which may reflect an aneurysm or infundibulum. 3. 1-2 mm posteriorly projecting vascular protrusion arising from the proximal cavernous left ICA, which may reflect an aneurysm or infundibulum.    Brooke Dare MD-PhD [facility] [phone] Available 7 AM to 7 PM, outside these hours please contact Neurologist on call listed on AMION

## 2021-05-30 NOTE — ED Notes (Signed)
PA Eulah Pont informed about patient's complaints and stated she will come see the patient

## 2021-05-30 NOTE — ED Notes (Signed)
Patient transported to MRI 

## 2021-06-11 ENCOUNTER — Telehealth: Payer: Self-pay | Admitting: Psychiatry

## 2021-06-11 ENCOUNTER — Ambulatory Visit: Payer: 59 | Admitting: Psychiatry

## 2021-06-11 NOTE — Telephone Encounter (Signed)
Noted, thank you

## 2021-06-11 NOTE — Telephone Encounter (Signed)
FYI- Pt had to reschedule appt just received a call that she was exposed to COVID at work.

## 2021-06-24 ENCOUNTER — Encounter: Payer: Self-pay | Admitting: Psychiatry

## 2021-06-24 ENCOUNTER — Ambulatory Visit: Payer: 59 | Admitting: Psychiatry
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
